# Patient Record
Sex: Male | Born: 1985 | Race: White | Hispanic: No | Marital: Married | Smoking: Never smoker
Health system: Southern US, Community
[De-identification: ages and names within clinical notes are randomized; demographics above are authoritative.]

---

## 2020-05-10 ENCOUNTER — Emergency Department
Admission: EM | Admit: 2020-05-10 | Discharge: 2020-05-10 | Disposition: A | Discharge status: Home / Self Care | Payer: BLUE CROSS/BLUE SHIELD | Attending: Emergency Medicine | Admitting: Emergency Medicine

## 2020-05-10 ENCOUNTER — Emergency Department: Payer: BLUE CROSS/BLUE SHIELD

## 2020-05-10 DIAGNOSIS — R102 Pelvic and perineal pain: Secondary | ICD-10-CM | POA: Diagnosis not present

## 2020-05-10 DIAGNOSIS — N200 Calculus of kidney: Secondary | ICD-10-CM | POA: Diagnosis not present

## 2020-05-10 DIAGNOSIS — R1030 Lower abdominal pain, unspecified: Secondary | ICD-10-CM | POA: Diagnosis present

## 2020-05-10 LAB — CBC WITH DIFFERENTIAL/PLATELET
Abs Immature Granulocytes: 0.03 10*3/uL (ref 0.00–0.07)
Basophils Absolute: 0.1 10*3/uL (ref 0.0–0.1)
Basophils Relative: 1 %
Eosinophils Absolute: 1.7 10*3/uL — ABNORMAL HIGH (ref 0.0–0.5)
Eosinophils Relative: 17 %
HCT: 40.2 % (ref 39.0–52.0)
Hemoglobin: 13.9 g/dL (ref 13.0–17.0)
Immature Granulocytes: 0 %
Lymphocytes Relative: 33 %
Lymphs Abs: 3.2 10*3/uL (ref 0.7–4.0)
MCH: 29.1 pg (ref 26.0–34.0)
MCHC: 34.6 g/dL (ref 30.0–36.0)
MCV: 84.3 fL (ref 80.0–100.0)
Monocytes Absolute: 0.8 10*3/uL (ref 0.1–1.0)
Monocytes Relative: 8 %
Neutro Abs: 3.9 10*3/uL (ref 1.7–7.7)
Neutrophils Relative %: 41 %
Platelets: 277 10*3/uL (ref 150–400)
RBC: 4.77 MIL/uL (ref 4.22–5.81)
RDW: 13 % (ref 11.5–15.5)
WBC: 9.5 10*3/uL (ref 4.0–10.5)
nRBC: 0 % (ref 0.0–0.2)

## 2020-05-10 LAB — COMPREHENSIVE METABOLIC PANEL
ALT: 27 U/L (ref 0–44)
AST: 25 U/L (ref 15–41)
Albumin: 4.3 g/dL (ref 3.5–5.0)
Alkaline Phosphatase: 65 U/L (ref 38–126)
Anion gap: 12 (ref 5–15)
BUN: 18 mg/dL (ref 6–20)
CO2: 27 mmol/L (ref 22–32)
Calcium: 9.2 mg/dL (ref 8.9–10.3)
Chloride: 101 mmol/L (ref 98–111)
Creatinine, Ser: 1.14 mg/dL (ref 0.61–1.24)
GFR calc Af Amer: >60 mL/min (ref >60)
GFR calc non Af Amer: >60 mL/min (ref >60)
Glucose, Bld: 119 mg/dL — ABNORMAL HIGH (ref 70–99)
Potassium: 3.5 mmol/L (ref 3.5–5.1)
Sodium: 140 mmol/L (ref 135–145)
Total Bilirubin: 0.8 mg/dL (ref 0.3–1.2)
Total Protein: 7.9 g/dL (ref 6.5–8.1)

## 2020-05-10 LAB — URINALYSIS, ROUTINE W REFLEX MICROSCOPIC
Bacteria, UA: NONE SEEN
Bilirubin Urine: NEGATIVE
Glucose, UA: NEGATIVE mg/dL
Hgb urine dipstick: NEGATIVE
Ketones, ur: NEGATIVE mg/dL
Leukocytes,Ua: NEGATIVE
Nitrite: NEGATIVE
Protein, ur: 30 mg/dL — AB
Specific Gravity, Urine: 1.024 (ref 1.005–1.030)
Squamous Epithelial / HPF: NONE SEEN (ref 0–5)
pH: 6 (ref 5.0–8.0)

## 2020-05-10 LAB — LIPASE, BLOOD: Lipase: 29 U/L (ref 11–51)

## 2020-05-10 MED ORDER — ONDANSETRON HCL 4 MG/2ML IJ SOLN
4.0000 mg | Freq: Once | INTRAMUSCULAR | Status: AC
  Administered 2020-05-10: 4 mg via INTRAVENOUS
  Filled 2020-05-10: qty 2

## 2020-05-10 MED ORDER — SODIUM CHLORIDE 0.9 % IV BOLUS
1000.0000 mL | Freq: Once | INTRAVENOUS | Status: AC
  Administered 2020-05-10: 1000 mL via INTRAVENOUS

## 2020-05-10 MED ORDER — FENTANYL CITRATE (PF) 100 MCG/2ML IJ SOLN
50.0000 ug | Freq: Once | INTRAMUSCULAR | Status: AC
  Administered 2020-05-10: 50 ug via INTRAVENOUS
  Filled 2020-05-10: qty 2

## 2020-05-10 NOTE — ED Notes (Signed)
Pt states coming in after waking up in the middle of the night with strong. Pt states pain comes and goes and is usually in the abdomen but sometimes radiates to the back.  Pt taken to CT, by CT tech

## 2020-05-10 NOTE — ED Triage Notes (Signed)
Patient with  Lower abdominal pain, described as sharp and intermittent. Woke him up at 0100. States vomiting x 1. Unknown if has dysuria as he has not urinated.

## 2020-05-10 NOTE — Discharge Instructions (Signed)
You were seen in the emergency room for abdominal pain.  I am suspicious that you may have passed a small kidney stone, but cannot be certain of the at this time.  It is important that you follow up closely with your primary care doctor and I also recommend Urology follow-up in the next few of days.  No driving this morning as you were given sedating medications for pain earlier.  Please return to the emergency room right away if you are to develop a fever, severe nausea, your pain becomes severe or worsens, you are unable to keep food down, begin vomiting any dark or bloody fluid, you develop any dark or bloody stools, feel dehydrated, or other new concerns or symptoms arise.

## 2020-05-10 NOTE — ED Provider Notes (Signed)
[facility] Emergency Department Provider Note   ____________________________________________   First MD Initiated Contact with Patient 05/10/20 0246     (approximate)  I have reviewed the triage vital signs and the nursing notes.   HISTORY  Chief Complaint Abdominal Pain    HPI Charles Zimmerman is a 34 y.o. male reports no significant past medical history   Patient reports he started having severe sudden onset lower abdominal pain at about 1 in the morning.  It woke him from sleep caused him to throw up once.  He has been healthy until this occurred.  He does report several weeks ago he noticed that he lost his sense of taste and smell and has not recovered from that yet, thinks he potentially had coronavirus but reports that was at least several weeks ago.  No fevers or chills.  No headaches.  No chest pain no trouble breathing.  No diarrhea.  Vomited once, nonbloody  Reports moderate to severe ongoing pain located in the middle of his lower pelvic region, some radiation to the left flank  History reviewed. No pertinent past medical history.  There are no problems to display for this patient.   History reviewed. No pertinent surgical history.  Prior to Admission medications   Not on File    Allergies Patient has no known allergies.  No family history on file.  Social History Social History   Tobacco Use   Smoking status: Never Smoker   Smokeless tobacco: Former Engineer, water Use Topics   Alcohol use: Not Currently   Drug use: Never    Review of Systems Constitutional: No fever/chills Eyes: No visual changes. ENT: No sore throat. Cardiovascular: Denies chest pain. Respiratory: Denies shortness of breath. Gastrointestinal: See HPI Genitourinary: Negative for dysuria.  Has not urinated since the pain began Musculoskeletal: Negative for back pain for slight discomfort in the left flank region. Skin: Negative for  rash. Neurological: Negative for headaches, areas of focal weakness or numbness.    ____________________________________________   PHYSICAL EXAM:  VITAL SIGNS: ED Triage Vitals  Enc Vitals Group     BP 05/10/20 0206 (!) 97/58     Pulse Rate 05/10/20 0206 72     Resp 05/10/20 0206 20     Temp 05/10/20 0206 97.6 F (36.4 C)     Temp Source 05/10/20 0206 Oral     SpO2 05/10/20 0206 98 %     Weight 05/10/20 0207 180 lb (81.6 kg)     Height 05/10/20 0207 5\' 10"  (1.778 m)     Head Circumference --      Peak Flow --      Pain Score 05/10/20 0213 7     Pain Loc --      Pain Edu? --      Excl. in GC? --     Constitutional: Alert and oriented.  Appears in moderate to severe pain.  Cannot seem to get comfortable. Eyes: Conjunctivae are normal. Head: Atraumatic. Nose: No congestion/rhinnorhea. Mouth/Throat: Mucous membranes are moist. Neck: No stridor.  Cardiovascular: Normal rate, regular rhythm. Grossly normal heart sounds.  Good peripheral circulation. Respiratory: Normal respiratory effort.  No retractions. Lungs CTAB. Gastrointestinal: Soft and nontender except he reports tenderness and pain to palpation across suprapubic region some in the left lower quadrant, no rebound guarding or distention noted. No distention. Musculoskeletal: No lower extremity tenderness nor edema. Neurologic:  Normal speech and language. No gross focal neurologic deficits are appreciated.  Skin:  Skin  is warm, dry and intact. No rash noted. Psychiatric: Mood and affect are normal. Speech and behavior are normal.  ____________________________________________   LABS (all labs ordered are listed, but only abnormal results are displayed)  Labs Reviewed  COMPREHENSIVE METABOLIC PANEL - Abnormal; Notable for the following components:      Result Value   Glucose, Bld 119 (*)    All other components within normal limits  CBC WITH DIFFERENTIAL/PLATELET - Abnormal; Notable for the following components:    Eosinophils Absolute 1.7 (*)    All other components within normal limits  URINALYSIS, ROUTINE W REFLEX MICROSCOPIC - Abnormal; Notable for the following components:   Color, Urine YELLOW (*)    APPearance CLEAR (*)    Protein, ur 30 (*)    All other components within normal limits  URINE CULTURE  LIPASE, BLOOD   ____________________________________________  EKG   ____________________________________________  RADIOLOGY  IMPRESSION: 1. Punctate nonobstructing calculus in the lower pole left kidney. No obstructive urolithiasis or hydronephrosis is seen however. 2. Circumferential thickening of the urinary bladder, possibly related to underdistention though should correlate with urinalysis to exclude cystitis. 3. No other acute abnormality in the abdomen or pelvis.  Of note, radiologist notes a normal appendix  ____________________________________________   PROCEDURES  Procedure(s) performed: None  Procedures  Critical Care performed: No  ____________________________________________   INITIAL IMPRESSION / ASSESSMENT AND PLAN / ED COURSE  Pertinent labs & imaging results that were available during my care of the patient were reviewed by me and considered in my medical decision making (see chart for details).   Differential diagnosis includes but is not limited to, abdominal perforation, aortic dissection, cholecystitis, appendicitis, diverticulitis, colitis, esophagitis/gastritis, kidney stone, pyelonephritis, urinary tract infection, aortic aneurysm. All are considered in decision and treatment plan. Based upon the patient's presentation and risk factors, and the patient's age and presentation sudden onset pain primarily in the lower abdomen slight radiation to left flank region.  Suspicious for possible etiology such as urologic, kidney stones, infectious, inflammatory etc.  We will proceed with imaging to further evaluate.  He is young healthy without notable risk  factors to suggest risk for dissection, aneurysm, perforation etc.   Clinical Course as of May 10 646  Sat May 10, 2020  7209 Patient reexamined at this time, reports all pain and symptoms are resolved.  He is resting comfortably without distress.  Reports just mild tenderness to palpation over the left suprapubic region.  Normal testicles, no hernias noted in the inguinal region no pain in inguinal region.  Patient reports he feels much improved   [MQ]    Clinical Course User Index [MQ] Sharyn Creamer, MD    ----------------------------------------- 6:47 AM on 05/10/2020 -----------------------------------------  Patient pain resolved.  Resting comfortably.  I am suspicious the patient may have passed a kidney stone, but certainly cannot be sure.  His pain is resolved now he feels well.  He does have a small kidney stone on CT in the left kidney, though I doubt this would be related to his pain today but suggestive of the possibility he could have passed this another stone.  He is resting comfortably, reassuring exam no distress.  Patient feels well, fully awake and alert.  Appears appropriate for discharge, recommendation to follow-up with primary as well as urology.  I did discuss and recommended to the patient that he not drive this morning as he was giving sedating medication in the ER.  He is understanding of this  Return precautions  and treatment recommendations and follow-up discussed with the patient who is agreeable with the plan.  ____________________________________________   FINAL CLINICAL IMPRESSION(S) / ED DIAGNOSES  Final diagnoses:  Suprapubic abdominal pain  Kidney stone on left side        Note:  This document was prepared using Dragon voice recognition software and may include unintentional dictation errors       Sharyn Creamer, MD 05/10/20 [phone]

## 2020-05-11 LAB — URINE CULTURE: Culture: NO GROWTH

## 2020-07-23 ENCOUNTER — Other Ambulatory Visit: Payer: BLUE CROSS/BLUE SHIELD

## 2020-07-23 DIAGNOSIS — Z20822 Contact with and (suspected) exposure to covid-19: Secondary | ICD-10-CM

## 2020-07-25 LAB — SARS-COV-2, NAA 2 DAY TAT

## 2020-07-25 LAB — NOVEL CORONAVIRUS, NAA: SARS-CoV-2, NAA: NOT DETECTED

## 2021-10-09 IMAGING — CT CT RENAL STONE PROTOCOL
3 of 4 series · 9 of 46 positions shown, 16 images · non-contrast
Comparison: None.

CLINICAL DATA: Flank pain, stone disease suspected

EXAM:
CT ABDOMEN AND PELVIS WITHOUT CONTRAST
TECHNIQUE: Multidetector CT imaging of the abdomen and pelvis was performed
following the standard protocol without IV contrast.

[Series 4: lung bases · axial · 0.75mm/px · z∈[-645,-555]mm · 5 of 28 slices shown, 10 images]
[im 5/28  soft-tissue]
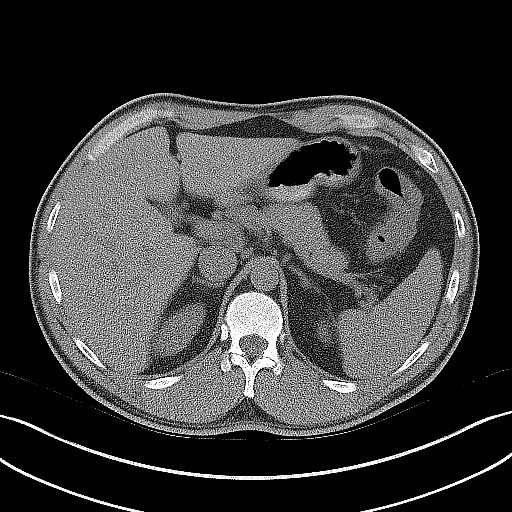
[im 5/28  bone]
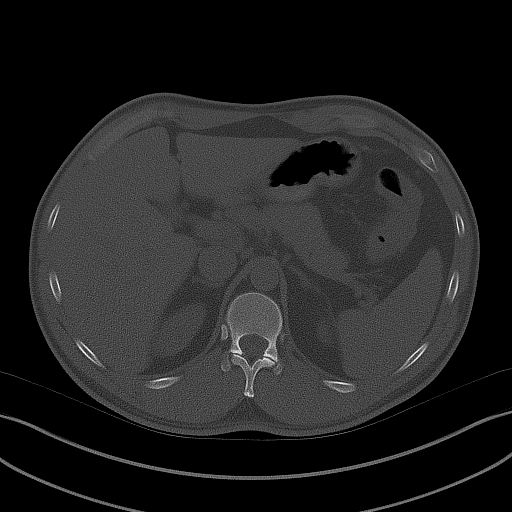
[im 10/28  soft-tissue]
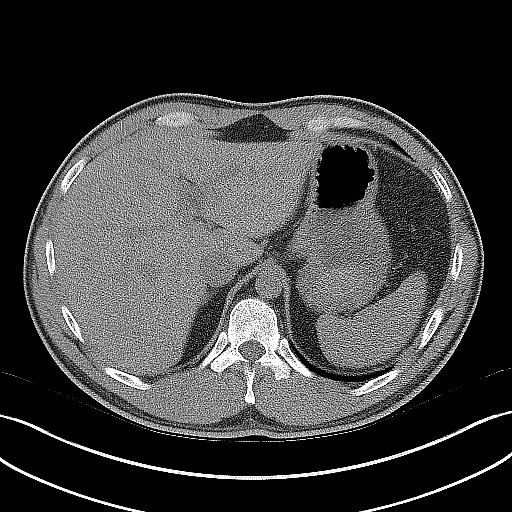
[im 10/28  lung]
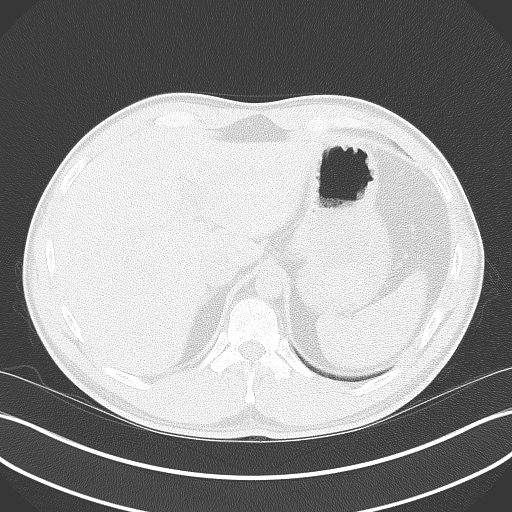
[im 14/28  soft-tissue]
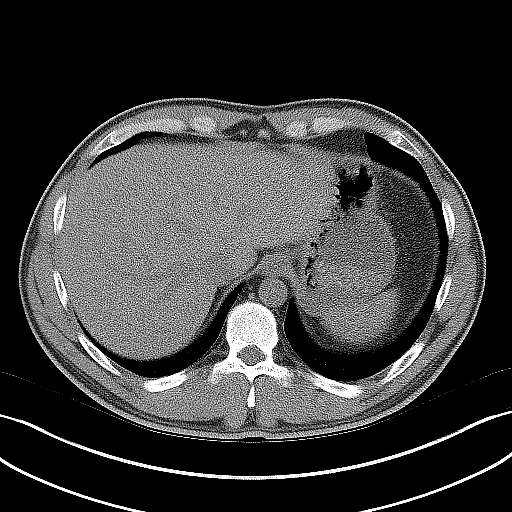
[im 14/28  lung]
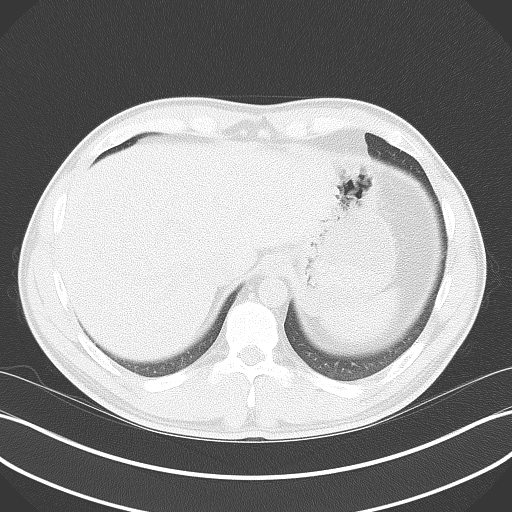
[im 19/28  soft-tissue]
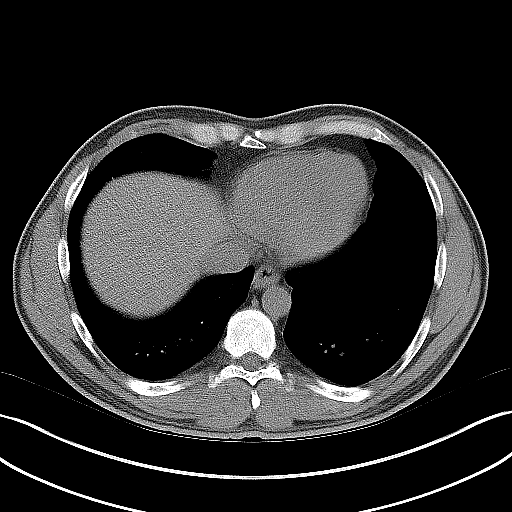
[im 19/28  lung]
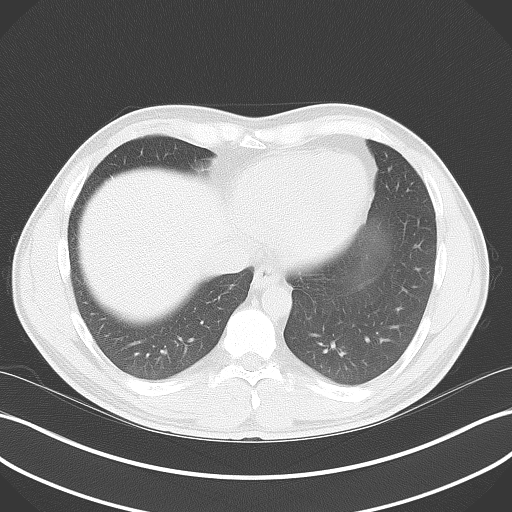
[im 23/28  soft-tissue]
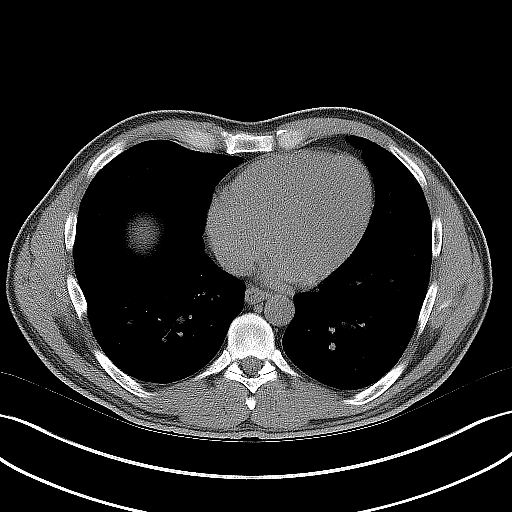
[im 23/28  lung]
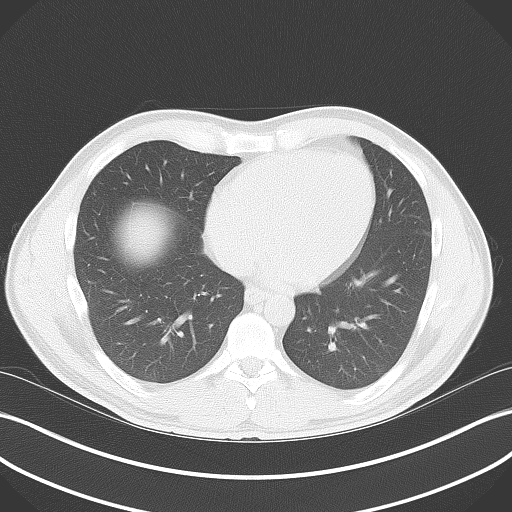

[Series 5: coronal · coronal · 0.75mm/px · 3 of 122 slices shown, 4 images]
[im 41/122  soft-tissue]
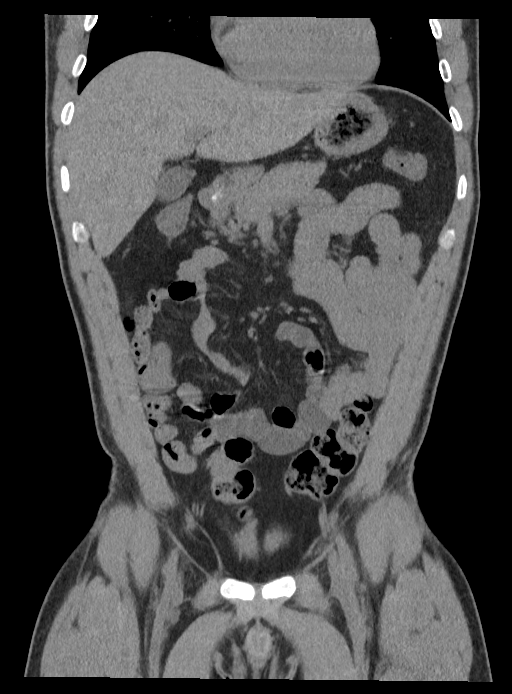
[im 54/122  soft-tissue]
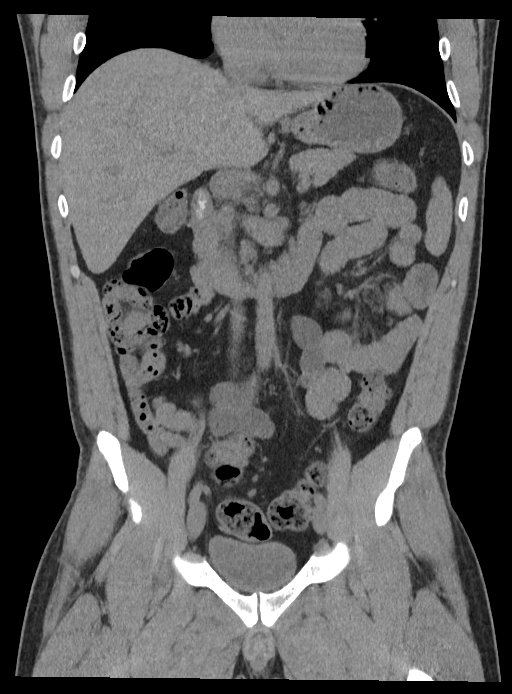
[im 54/122  bone]
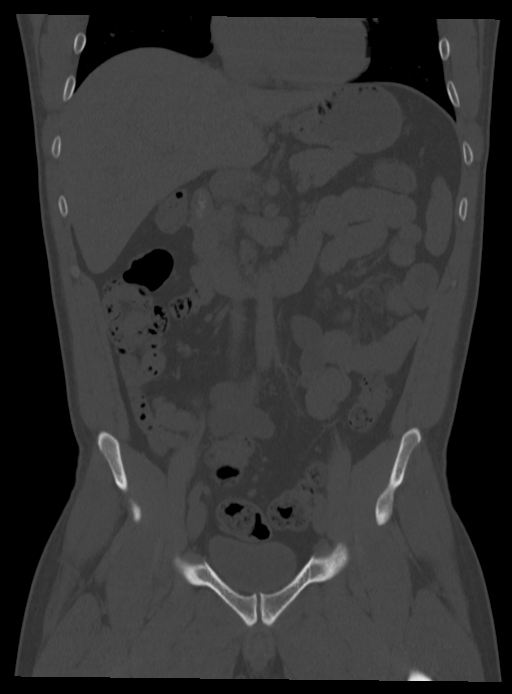
[im 68/122  soft-tissue]
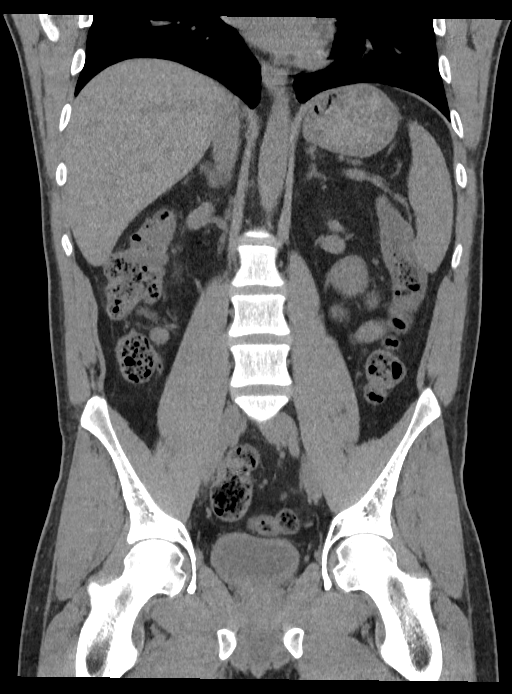

[Series 6: sagittal · sagittal · 0.57mm/px · 1 of 167 slices shown, 2 images]
[im 56/167  soft-tissue]
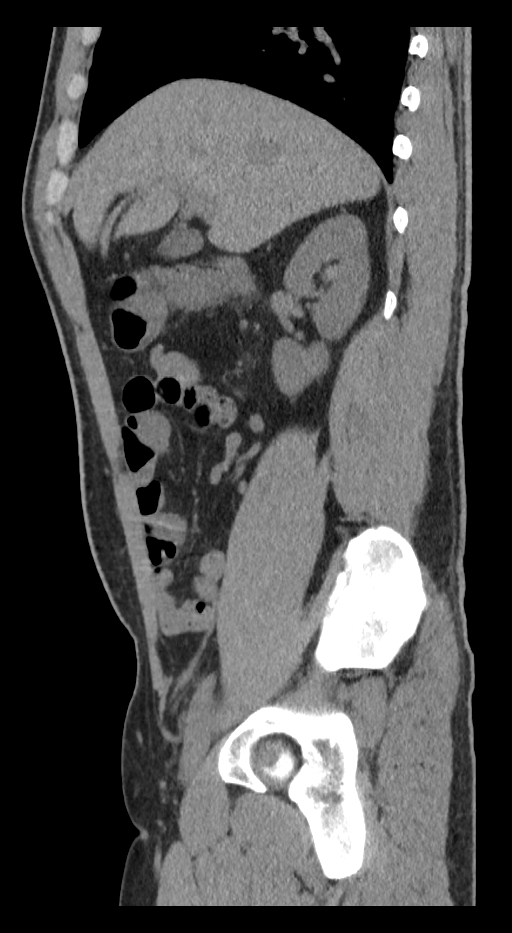
[im 56/167  bone]
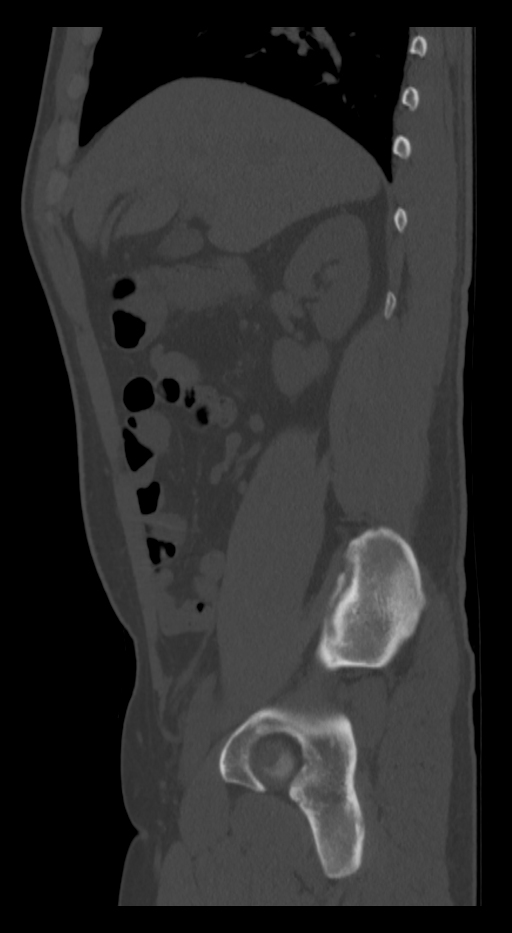

[9 of 46 positions shown; findings below may reference images not displayed]

FINDINGS: Lower chest: Lung bases are clear. Normal heart size. No pericardial
effusion.

Hepatobiliary: No visible concerning liver lesion. Smooth liver
surface contour. Normal liver attenuation. Normal gallbladder and
biliary tree without visible calcified gallstone.

Pancreas: Unremarkable. No pancreatic ductal dilatation or
surrounding inflammatory changes.

Spleen: Normal in size. No concerning splenic lesions.

Adrenals/Urinary Tract: No concerning adrenal nodules. Kidneys are
symmetric in size and normally located. Punctate nonobstructing
calculus in the lower pole left kidney. No obstructive urolithiasis
or hydronephrosis is seen however. Circumferential thickening of the
urinary bladder, possibly related to underdistention though should
correlate with urinalysis.

Stomach/Bowel: Distal esophagus, stomach and duodenal sweep are
unremarkable. No small bowel wall thickening or dilatation. No
evidence of obstruction. A normal appendix is visualized. No colonic
dilatation or wall thickening.

Vascular/Lymphatic: No significant vascular findings are present
within the limitations of this unenhanced CT. No enlarged abdominal
or pelvic lymph nodes.

Reproductive: The prostate and seminal vesicles are unremarkable.
Few benign phleboliths in the pelvis.

Other: No abdominopelvic free fluid or free gas. No bowel containing
hernias.

Musculoskeletal: No acute osseous abnormality or suspicious osseous
lesion.
IMPRESSION: 1. Punctate nonobstructing calculus in the lower pole left kidney.
No obstructive urolithiasis or hydronephrosis is seen however.
2. Circumferential thickening of the urinary bladder, possibly
related to underdistention though should correlate with urinalysis
to exclude cystitis.
3. No other acute abnormality in the abdomen or pelvis.

## 2024-03-20 ENCOUNTER — Encounter: Payer: Self-pay | Admitting: Emergency Medicine

## 2024-03-20 ENCOUNTER — Ambulatory Visit
Admission: EM | Admit: 2024-03-20 | Discharge: 2024-03-20 | Disposition: A | Discharge status: Home / Self Care | Payer: Self-pay | Attending: Emergency Medicine | Admitting: Emergency Medicine

## 2024-03-20 DIAGNOSIS — H9201 Otalgia, right ear: Secondary | ICD-10-CM

## 2024-03-20 DIAGNOSIS — L739 Follicular disorder, unspecified: Secondary | ICD-10-CM

## 2024-03-20 MED ORDER — PREDNISONE 20 MG PO TABS: 40.0000 mg | ORAL_TABLET | Freq: Every day | ORAL | 0 refills | Status: AC

## 2024-03-20 MED ORDER — CEPHALEXIN 500 MG PO CAPS: 500.0000 mg | ORAL_CAPSULE | Freq: Four times a day (QID) | ORAL | 0 refills | Status: AC

## 2024-03-20 NOTE — ED Triage Notes (Signed)
 Patient complains of right ear pain x 1 week.took 600 mg of  Ibuprofen 1 hour ago. Rates pain 5/10.

## 2024-03-20 NOTE — Discharge Instructions (Signed)
 Today you are evaluated for your ear pain however on exam there are no signs of infection such as redness swelling or drainage however there are several areas within your scalp which look inflamed and irritated and therefore I will place you on antibiotic as this is most likely the source of your discomfort as pain is radiating from the scalp  Take cephalexin every 6 hours for 5 days for treatment of bacteria  Begin prednisone every morning with food for 5 days to reduce inflammation and help with pain, may take Tylenol additionally as needed  May apply warm compresses to the ear and to the scalp  Until rash to the scalp clears avoid getting haircut as this can cause further irritation  Please follow-up if you have any further concerns or symptoms do not improve

## 2024-03-20 NOTE — ED Provider Notes (Signed)
 Charles Zimmerman    CSN: [id number] Arrival date & time: 03/20/24  9178      History   Chief Complaint Chief Complaint  Patient presents with   Otalgia    HPI Charles Zimmerman is a 38 y.o. male.   Patient presents for evaluation of right sided ear pain present for 7 days.  Initially felt a knot under the ear which she thought to be a swollen lymph node, progressively worsening and began to experience pain and pruritus to the external ear.  Overnight experiencing pulsating pain interfering with sleep.  Has attempted use of ibuprofen which has been ineffective.  Denies decreased hearing, fever, congestion or ear drainage.  Recent trip to the beach.  Currently has rash to the scalp that started within the past week after getting haircut.  History reviewed. No pertinent past medical history.  There are no active problems to display for this patient.   History reviewed. No pertinent surgical history.     Home Medications    Prior to Admission medications   Medication Sig Start Date End Date Taking? Authorizing Provider  cephALEXin (KEFLEX) 500 MG capsule Take 1 capsule (500 mg total) by mouth 4 (four) times daily. 03/20/24  Yes Teondra Newburg R, NP  predniSONE (DELTASONE) 20 MG tablet Take 2 tablets (40 mg total) by mouth daily. 03/20/24  Yes Teresa Shelba SAUNDERS, NP    Family History History reviewed. No pertinent family history.  Social History Social History   Tobacco Use   Smoking status: Never   Smokeless tobacco: Former  Substance Use Topics   Alcohol use: Not Currently   Drug use: Never     Allergies   Patient has no known allergies.   Review of Systems Review of Systems   Physical Exam Triage Vital Signs ED Triage Vitals  Encounter Vitals Group     BP 03/20/24 0827 117/84     Girls Systolic BP Percentile --      Girls Diastolic BP Percentile --      Boys Systolic BP Percentile --      Boys Diastolic BP Percentile --      Pulse Rate 03/20/24 0827  65     Resp 03/20/24 0827 18     Temp 03/20/24 0827 97.9 F (36.6 C)     Temp Source 03/20/24 0827 Oral     SpO2 03/20/24 0827 97 %     Weight --      Height --      Head Circumference --      Peak Flow --      Pain Score 03/20/24 0829 5     Pain Loc --      Pain Education --      Exclude from Growth Chart --    No data found.  Updated Vital Signs BP 117/84 (BP Location: Left Arm)   Pulse 65   Temp 97.9 F (36.6 C) (Oral)   Resp 18   SpO2 97%   Visual Acuity Right Eye Distance:   Left Eye Distance:   Bilateral Distance:    Right Eye Near:   Left Eye Near:    Bilateral Near:     Physical Exam Constitutional:      Appearance: Normal appearance.  HENT:     Right Ear: Tympanic membrane, ear canal and external ear normal.     Left Ear: Tympanic membrane, ear canal and external ear normal.   Eyes:     Extraocular Movements: Extraocular  movements intact.   Pulmonary:     Effort: Pulmonary effort is normal.   Skin:    Comments: Defer to photo    Neurological:     Mental Status: He is alert and oriented to person, place, and time.      UC Treatments / Results  Labs (all labs ordered are listed, but only abnormal results are displayed) Labs Reviewed - No data to display  EKG   Radiology No results found.  Procedures Procedures (including critical care time)  Medications Ordered in UC Medications - No data to display  Initial Impression / Assessment and Plan / UC Course  I have reviewed the triage vital signs and the nursing notes.  Pertinent labs & imaging results that were available during my care of the patient were reviewed by me and considered in my medical decision making (see chart for details).  Folliculitis, otalgia right ear  No abnormality to the internal or external ear however  several areas of erythema and inflammation within the scalp most likely cause of symptoms, discussed this with patient, prescribed cephalexin and prednisone  recommended supportive care and advised follow-up for persisting or worsening symptoms Final Clinical Impressions(s) / UC Diagnoses   Final diagnoses:  Otalgia of right ear  Folliculitis     Discharge Instructions      Today you are evaluated for your ear pain however on exam there are no signs of infection such as redness swelling or drainage however there are several areas within your scalp which look inflamed and irritated and therefore I will place you on antibiotic as this is most likely the source of your discomfort as pain is radiating from the scalp  Take cephalexin every 6 hours for 5 days for treatment of bacteria  Begin prednisone every morning with food for 5 days to reduce inflammation and help with pain, may take Tylenol additionally as needed  May apply warm compresses to the ear and to the scalp  Until rash to the scalp clears avoid getting haircut as this can cause further irritation  Please follow-up if you have any further concerns or symptoms do not improve   ED Prescriptions     Medication Sig Dispense Auth. Provider   cephALEXin (KEFLEX) 500 MG capsule Take 1 capsule (500 mg total) by mouth 4 (four) times daily. 20 capsule Harol Shabazz R, NP   predniSONE (DELTASONE) 20 MG tablet Take 2 tablets (40 mg total) by mouth daily. 10 tablet Teena Mangus R, NP      PDMP not reviewed this encounter.   Teresa Shelba SAUNDERS, [place] 03/20/24 [phone]
